# Patient Record
Sex: Female | Born: 1990 | Race: Black or African American | Hispanic: No | Smoking: Former smoker
Health system: Southern US, Community
[De-identification: ages and names within clinical notes are randomized; demographics above are authoritative.]

## PROBLEM LIST (undated history)

## (undated) ENCOUNTER — Inpatient Hospital Stay (HOSPITAL_COMMUNITY): Payer: Self-pay

## (undated) DIAGNOSIS — D219 Benign neoplasm of connective and other soft tissue, unspecified: Secondary | ICD-10-CM

## (undated) HISTORY — PX: NO PAST SURGERIES: SHX2092

---
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
## 2010-03-09 ENCOUNTER — Inpatient Hospital Stay (HOSPITAL_COMMUNITY)
Admission: AD | Admit: 2010-03-09 | Discharge: 2010-03-09 | Payer: Self-pay | Source: Home / Self Care | Attending: Obstetrics and Gynecology | Admitting: Obstetrics and Gynecology

## 2010-06-02 LAB — URINALYSIS, ROUTINE W REFLEX MICROSCOPIC
Glucose, UA: NEGATIVE mg/dL
Leukocytes, UA: NEGATIVE

## 2010-06-02 LAB — CBC
Hemoglobin: 11.3 g/dL — ABNORMAL LOW (ref 12.0–15.0)
MCH: 28.5 pg (ref 26.0–34.0)
MCHC: 32.8 g/dL (ref 30.0–36.0)
MCV: 86.9 fL (ref 78.0–100.0)
Platelets: 160 10*3/uL (ref 150–400)
RBC: 3.96 MIL/uL (ref 3.87–5.11)
RDW: 12.6 % (ref 11.5–15.5)
WBC: 6.8 10*3/uL (ref 4.0–10.5)

## 2010-06-02 LAB — WET PREP, GENITAL
Trich, Wet Prep: NONE SEEN
Yeast Wet Prep HPF POC: NONE SEEN

## 2010-06-02 LAB — URINE MICROSCOPIC-ADD ON

## 2010-06-02 LAB — GC/CHLAMYDIA PROBE AMP, GENITAL: Chlamydia, DNA Probe: NEGATIVE

## 2010-06-02 LAB — POCT PREGNANCY, URINE: Preg Test, Ur: NEGATIVE

## 2010-08-05 NOTE — Discharge Summary (Signed)
NAMEMERRISA, Ann Ramirez                 ACCOUNT NO.:  [id number]   MEDICAL RECORD NO.:  [id number]          PATIENT TYPE:  INP   LOCATION:  9151                          FACILITY:  [facility]   PHYSICIAN:  Norton Blizzard, MD    DATE OF BIRTH:  Aug 18, 1990   DATE OF ADMISSION:  02/24/2007  DATE OF DISCHARGE:  02/25/2007                               DISCHARGE SUMMARY   ADMISSION DIAGNOSES:  1. Intrauterine pregnancy at [redacted] weeks gestation.  2. Variable decelerations.  3. Contractions.  4. History of positive Chlamydia.   DISCHARGE DIAGNOSES:  1. Intrauterine pregnancy at [redacted] weeks gestation.  2. Reactive fetal monitoring  throughout her stay.  3. Contractions without cervical change.  4. History of positive Chlamydia.   PROCEDURE:  Patient did have an ultrasound for biophysical profile  performed on February 24, 2007, showing single intrauterine gestation  and cephalic presentation.  Placenta was anterior above the cervical os.  AFI was 19.40 cm in the 74th percentile.  Biophysical profile was 8/10  with 2 off for fetal breathing not observed.   COMPLICATIONS:  None.   CONSULTATIONS:  None.   PERTINENT LABORATORY DATA:  Patient had no labs performed while she was  inpatient.   BRIEF PERTINENT ADMISSION HISTORY:  This is an 20 year old gravida 1,  para 0 at 37 weeks zero days gestation, presenting with complaints of  contractions to the MAU.  She was evaluated and while monitoring her,  she was noted to have a couple of variable decelerations down to the 80s  for about two minutes.  Biophysical profile was performed showing score  of 8/10 with an AFI of 19.4 and was two points counted off for no fetal  breathing.  Patient was contracting every four to five minutes upon  admission.  On admission, her cervical examination was 1, long and high.   HOSPITAL COURSE:  Patient was admitted to antenatal for continuous  external fetal monitoring and tocometry.  Throughout her stay, her fetal  heart tracing was reactive with no variable decelerations noted.  She  continued to have contractions every three to five minutes.  She was  checked three times throughout her stay without any cervical change  noted.  Her pain with contractions were controlled with Stadol IV.  She  was in stable condition with reactive NST at time of discharge without  variable decelerations noted throughout her stay.   CONDITION ON DISCHARGE:  Stable.   DISCHARGE MEDICATIONS:  Patient is to continue prenatal vitamins as  prescribed.   DISCHARGE INSTRUCTIONS:  1. Discharged home.  2. Activity as tolerated.  3. Regular diet.  4. She is to follow up at [facility] at her      scheduled appointment.      Karlton Lemon, MD      Norton Blizzard, MD  Electronically Signed    NS/MEDQ  D:  02/25/2007  T:  02/25/2007  Job:  [id number]

## 2010-12-26 LAB — RPR: RPR Ser Ql: NONREACTIVE

## 2010-12-26 LAB — CBC
Platelets: 196
RDW: 17.6 — ABNORMAL HIGH

## 2011-09-05 IMAGING — US US TRANSVAGINAL NON-OB
1 series · 13 of 25 positions shown · non-contrast
Comparison: None

CLINICAL DATA: Pelvic pain.



[Series 1: us transvaginal non-ob · 13 of 60 slices shown]
[im 1/60]
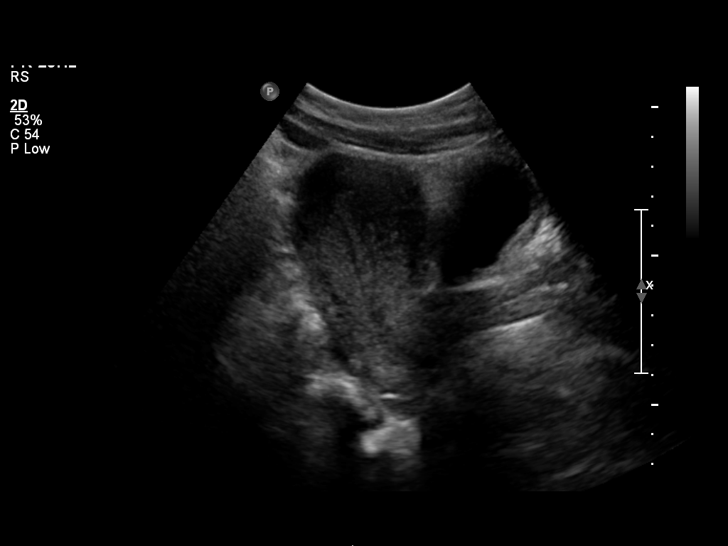
[im 5/60]
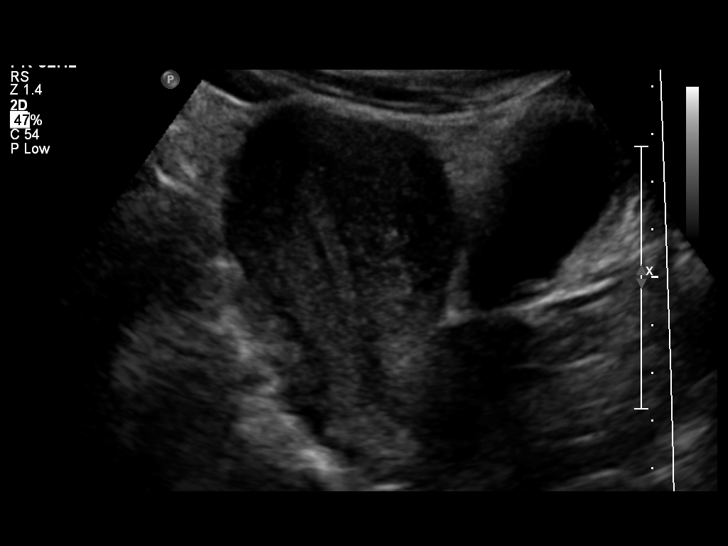
[im 10/60]
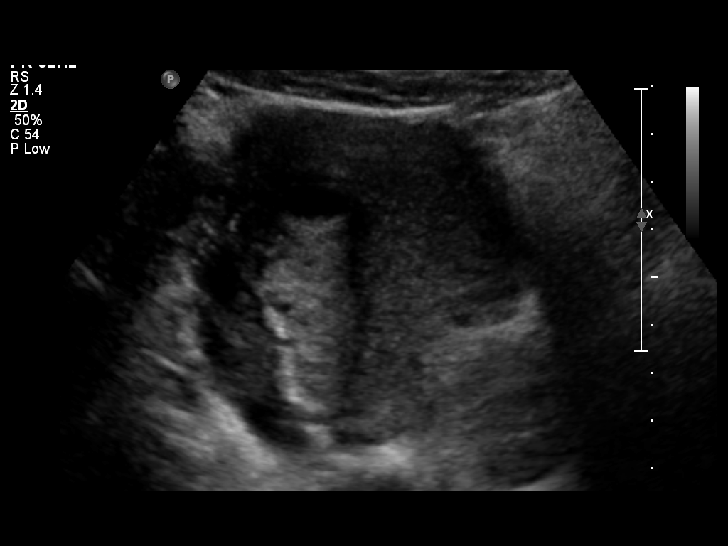
[im 15/60]
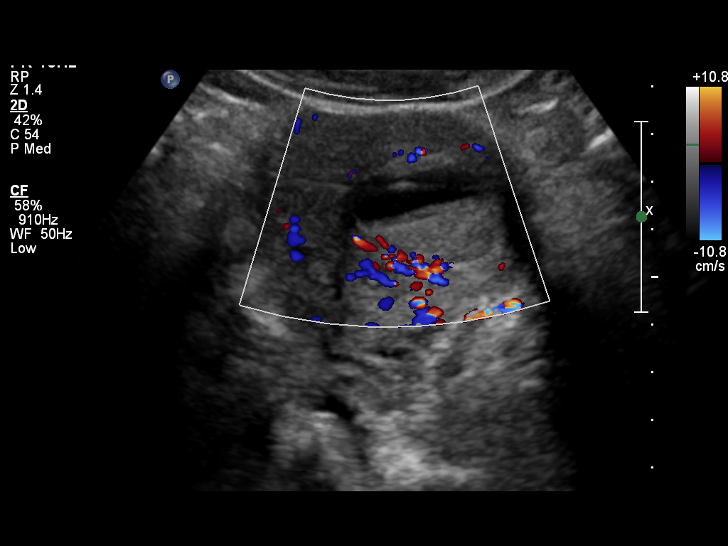
[im 20/60]
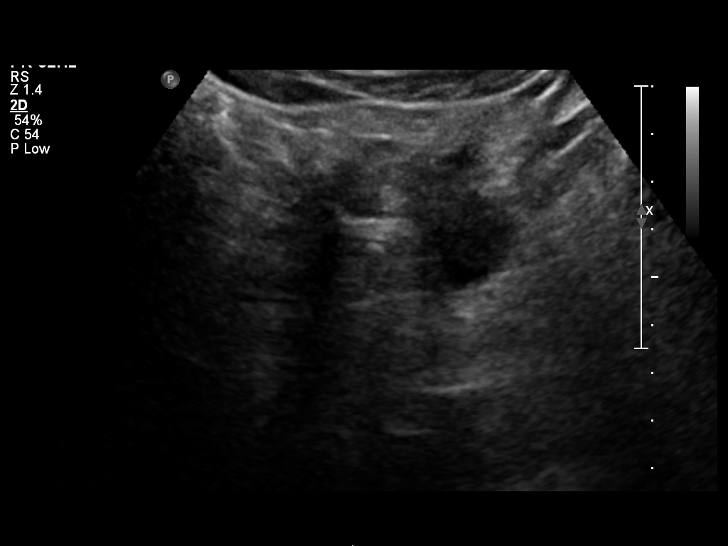
[im 25/60]
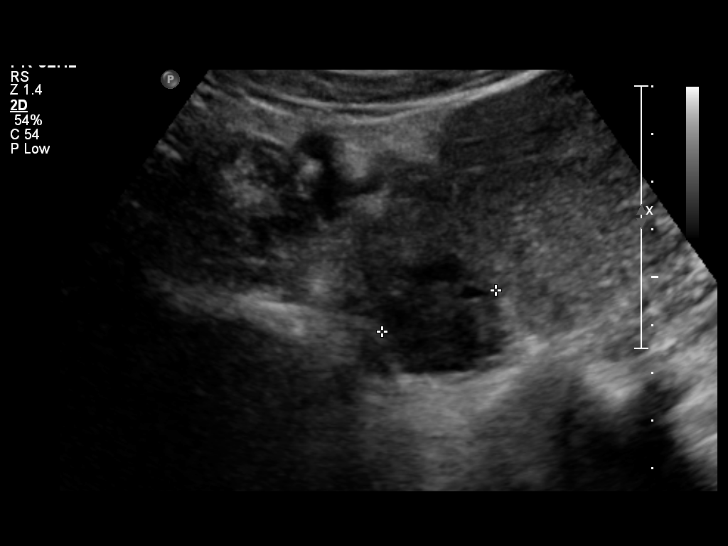
[im 30/60]
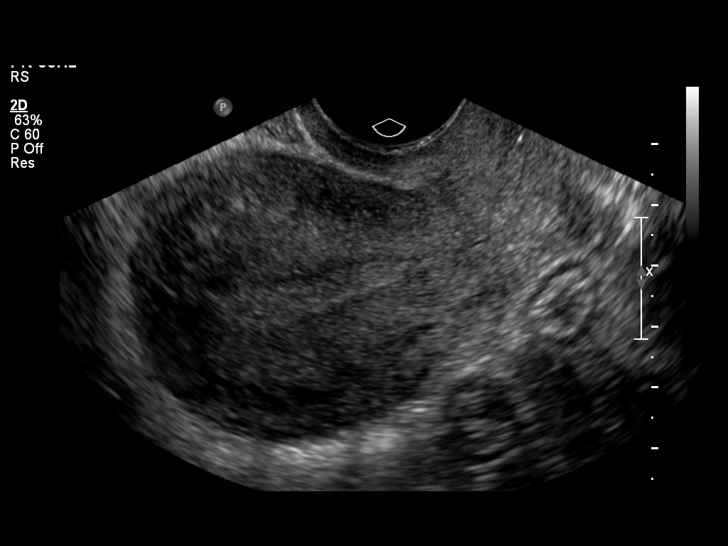
[im 35/60]
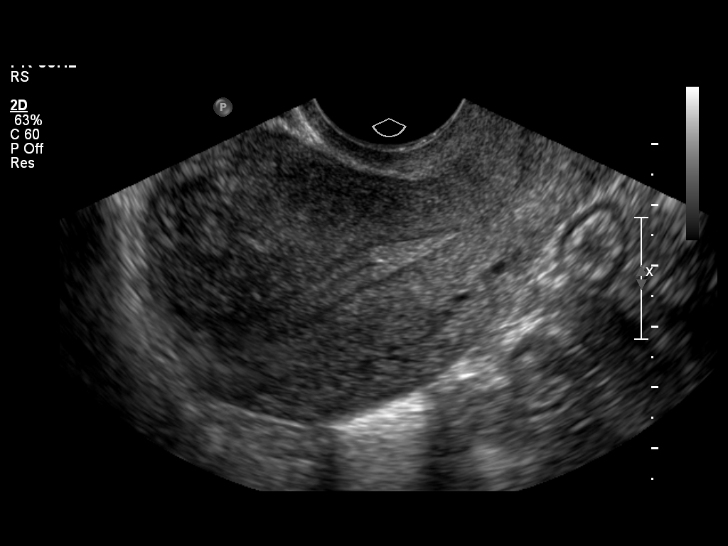
[im 40/60]
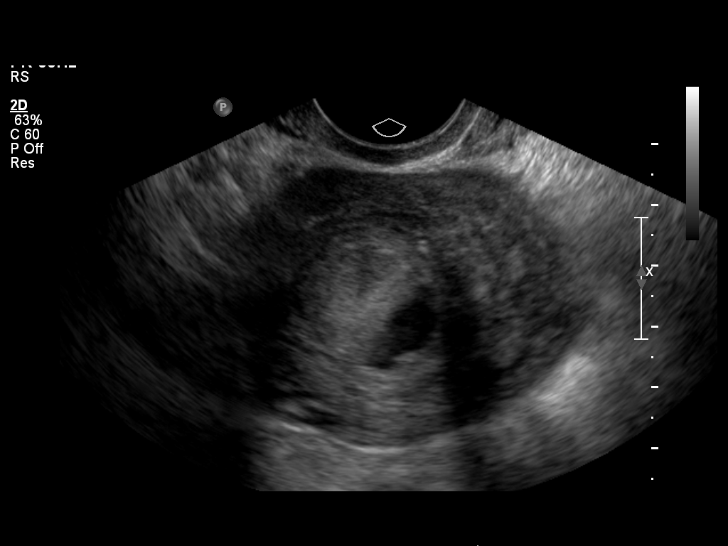
[im 45/60]
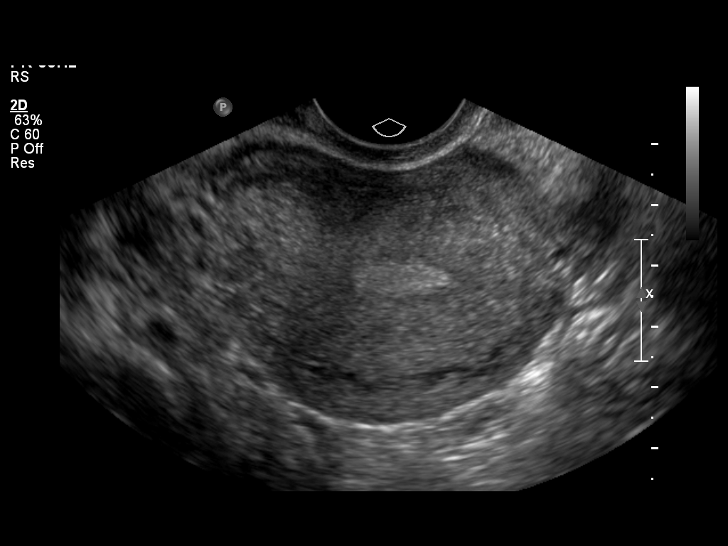
[im 50/60]
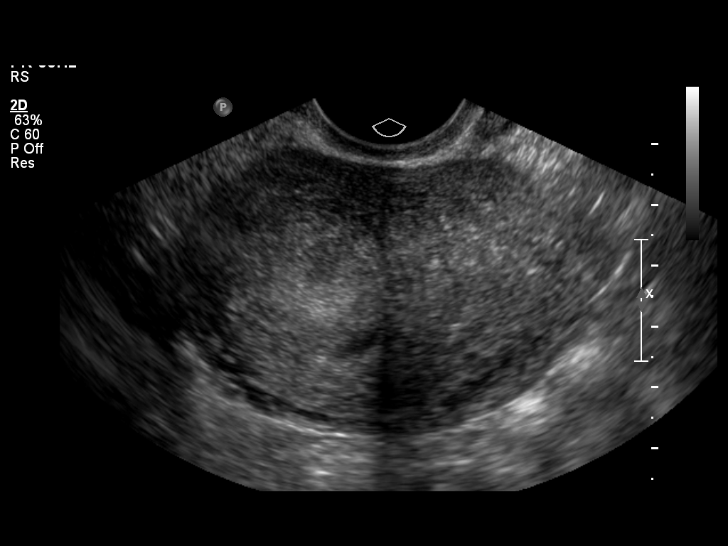
[im 55/60]
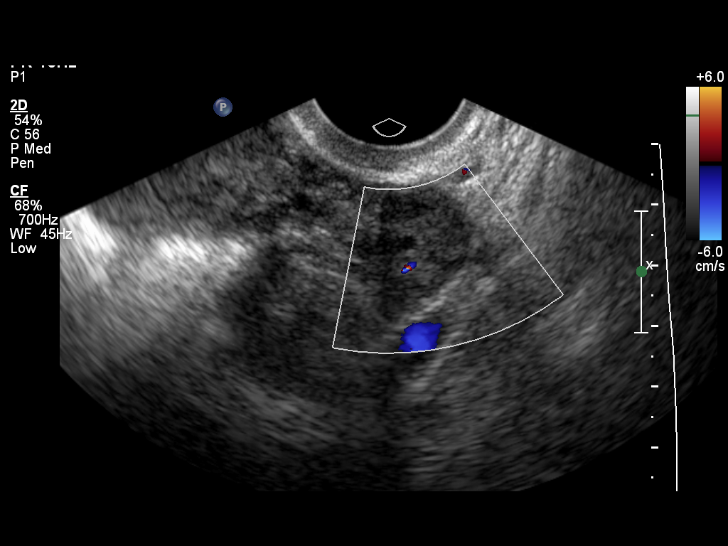
[im 60/60]
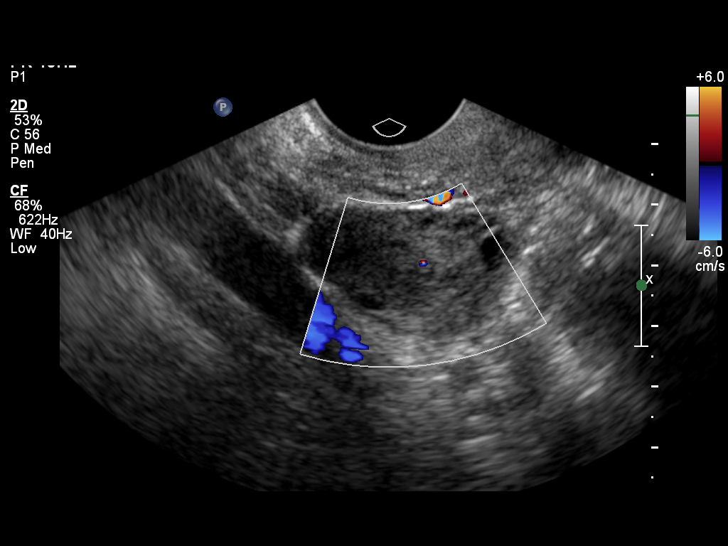

[13 of 25 positions shown; findings below may reference images not displayed]

FINDINGS: Uterus measures 8.2 x 5.0 x 6.4 cm.  There is a probable
bicornuate uterus with a rudimentary right uterine horn.  This ends
in a complex fluid fluid collection measuring 3.1 x 3.8 x 3.1 cm.
This is likely fluid and blood in the rudimentary horn.  This was
described on ultrasound 6112.

Endometrium normal in thickness measuring a maximum of 7.7 mm.

Right Ovary measures 3.5 x 1.9 x 1.9 cm.  No cysts or masses.

Left Ovary measures 2.7 x 1.9 x 2.2 cm.  No cysts or masses.

Other Findings:  No free pelvic fluid collections.
IMPRESSION: 1.  Probable bicornuate uterus with a rudimentary right uterine
horn containing a complex fluid fluid collection measuring 3.1 x
3.8 x 3 for 1 cm.  This was also described in 6112.
2.  Normal endometrial thickness.
3.  Normal ovaries.

## 2013-07-01 ENCOUNTER — Encounter (HOSPITAL_COMMUNITY): Payer: Self-pay | Admitting: *Deleted

## 2013-07-01 ENCOUNTER — Inpatient Hospital Stay (HOSPITAL_COMMUNITY)
Admission: AD | Admit: 2013-07-01 | Discharge: 2013-07-02 | Disposition: A | Discharge status: Home / Self Care | Payer: Medicaid - Out of State | Source: Ambulatory Visit | Attending: Obstetrics & Gynecology | Admitting: Obstetrics & Gynecology

## 2013-07-01 DIAGNOSIS — O479 False labor, unspecified: Secondary | ICD-10-CM | POA: Insufficient documentation

## 2013-07-01 HISTORY — DX: Benign neoplasm of connective and other soft tissue, unspecified: D21.9

## 2013-07-01 MED ORDER — MORPHINE SULFATE 10 MG/ML IJ SOLN
15.0000 mg | Freq: Once | INTRAMUSCULAR | Status: AC
  Administered 2013-07-01: 15 mg via INTRAMUSCULAR
  Filled 2013-07-01: qty 2

## 2013-07-01 MED ORDER — PROMETHAZINE HCL 25 MG/ML IJ SOLN
25.0000 mg | Freq: Once | INTRAMUSCULAR | Status: AC
  Administered 2013-07-01: 25 mg via INTRAMUSCULAR
  Filled 2013-07-01: qty 1

## 2013-07-01 NOTE — MAU Note (Signed)
Pt gets prenatal  care in [place]. Was 1 cm last week.

## 2013-07-01 NOTE — MAU Note (Signed)
Pt report she has been having ct q 5-7 min . Has prenatal care in [place]. Was 1 cm on Wed. Denies SROM or bleeding and reports good fetal movement.

## 2013-07-02 MED ORDER — ZOLPIDEM TARTRATE 5 MG PO TABS: 5.0000 mg | ORAL_TABLET | Freq: Every evening | ORAL | Status: AC | PRN

## 2013-07-02 MED ORDER — ZOLPIDEM TARTRATE 5 MG PO TABS: 5.0000 mg | ORAL_TABLET | Freq: Every evening | ORAL | Status: DC | PRN

## 2014-01-22 ENCOUNTER — Encounter (HOSPITAL_COMMUNITY): Payer: Self-pay | Admitting: *Deleted

## 2014-05-06 ENCOUNTER — Encounter (HOSPITAL_COMMUNITY): Payer: Self-pay | Admitting: *Deleted

## 2019-01-14 ENCOUNTER — Telehealth: Payer: Self-pay | Admitting: Obstetrics and Gynecology

## 2019-01-14 NOTE — Telephone Encounter (Signed)
Error. Wrong patient Laury Deep, CNM
